# Patient Record
Sex: Female | Born: 1945 | Race: White | Hispanic: No | Marital: Married | Smoking: Never smoker
Health system: Southern US, Community
[De-identification: ages and names within clinical notes are randomized; demographics above are authoritative.]

## PROBLEM LIST (undated history)

## (undated) DIAGNOSIS — J45909 Unspecified asthma, uncomplicated: Secondary | ICD-10-CM

## (undated) DIAGNOSIS — F32A Depression, unspecified: Secondary | ICD-10-CM

## (undated) DIAGNOSIS — F329 Major depressive disorder, single episode, unspecified: Secondary | ICD-10-CM

## (undated) HISTORY — PX: JOINT REPLACEMENT: SHX530

---

## 2009-08-27 ENCOUNTER — Inpatient Hospital Stay (HOSPITAL_COMMUNITY): Admission: RE | Admit: 2009-08-27 | Discharge: 2009-08-31 | Payer: Self-pay | Admitting: Orthopedic Surgery

## 2009-10-11 ENCOUNTER — Observation Stay (HOSPITAL_COMMUNITY): Admission: RE | Admit: 2009-10-11 | Discharge: 2009-10-13 | Payer: Self-pay | Admitting: Orthopedic Surgery

## 2010-02-13 ENCOUNTER — Ambulatory Visit (HOSPITAL_COMMUNITY)
Admission: RE | Admit: 2010-02-13 | Discharge: 2010-02-14 | Payer: Self-pay | Source: Home / Self Care | Admitting: Orthopedic Surgery

## 2010-07-25 LAB — URINALYSIS, ROUTINE W REFLEX MICROSCOPIC
Glucose, UA: NEGATIVE mg/dL
Hgb urine dipstick: NEGATIVE
Protein, ur: NEGATIVE mg/dL
Specific Gravity, Urine: 1.035 — ABNORMAL HIGH (ref 1.005–1.030)
pH: 5.5 (ref 5.0–8.0)

## 2010-07-25 LAB — COMPREHENSIVE METABOLIC PANEL
ALT: 15 U/L (ref 0–35)
Albumin: 4 g/dL (ref 3.5–5.2)
Alkaline Phosphatase: 88 U/L (ref 39–117)
Chloride: 105 mEq/L (ref 96–112)
Glucose, Bld: 109 mg/dL — ABNORMAL HIGH (ref 70–99)
Potassium: 3.9 mEq/L (ref 3.5–5.1)
Sodium: 140 mEq/L (ref 135–145)
Total Bilirubin: 0.7 mg/dL (ref 0.3–1.2)
Total Protein: 7.4 g/dL (ref 6.0–8.3)

## 2010-07-25 LAB — PROTIME-INR: Prothrombin Time: 13.6 seconds (ref 11.6–15.2)

## 2010-07-25 LAB — SURGICAL PCR SCREEN: MRSA, PCR: NEGATIVE

## 2010-07-25 LAB — CBC
HCT: 40 % (ref 36.0–46.0)
RBC: 4.49 MIL/uL (ref 3.87–5.11)
RDW: 14.8 % (ref 11.5–15.5)
WBC: 6.7 10*3/uL (ref 4.0–10.5)

## 2010-07-29 LAB — URINE MICROSCOPIC-ADD ON

## 2010-07-29 LAB — COMPREHENSIVE METABOLIC PANEL
AST: 23 U/L (ref 0–37)
Albumin: 3.7 g/dL (ref 3.5–5.2)
CO2: 29 mEq/L (ref 19–32)
Calcium: 9.1 mg/dL (ref 8.4–10.5)
Creatinine, Ser: 0.54 mg/dL (ref 0.4–1.2)
GFR calc Af Amer: >60 mL/min (ref >60)
GFR calc non Af Amer: >60 mL/min (ref >60)
Total Protein: 7.6 g/dL (ref 6.0–8.3)

## 2010-07-29 LAB — URINALYSIS, ROUTINE W REFLEX MICROSCOPIC
Nitrite: NEGATIVE
Specific Gravity, Urine: 1.031 — ABNORMAL HIGH (ref 1.005–1.030)
pH: 5.5 (ref 5.0–8.0)

## 2010-07-29 LAB — CBC
MCHC: 33.5 g/dL (ref 30.0–36.0)
MCV: 92.7 fL (ref 78.0–100.0)
Platelets: 212 10*3/uL (ref 150–400)
RDW: 13.2 % (ref 11.5–15.5)

## 2010-07-29 LAB — APTT: aPTT: 29 seconds (ref 24–37)

## 2010-07-30 LAB — CBC
HCT: 34.3 % — ABNORMAL LOW (ref 36.0–46.0)
HCT: 35.2 % — ABNORMAL LOW (ref 36.0–46.0)
Hemoglobin: 10.8 g/dL — ABNORMAL LOW (ref 12.0–15.0)
Hemoglobin: 11.9 g/dL — ABNORMAL LOW (ref 12.0–15.0)
MCHC: 33.7 g/dL (ref 30.0–36.0)
MCHC: 34.4 g/dL (ref 30.0–36.0)
MCV: 93.2 fL (ref 78.0–100.0)
MCV: 93.5 fL (ref 78.0–100.0)
MCV: 94.1 fL (ref 78.0–100.0)
Platelets: 112 10*3/uL — ABNORMAL LOW (ref 150–400)
RBC: 3.74 MIL/uL — ABNORMAL LOW (ref 3.87–5.11)
RDW: 13 % (ref 11.5–15.5)
RDW: 13.4 % (ref 11.5–15.5)

## 2010-07-30 LAB — TYPE AND SCREEN
ABO/RH(D): O NEG
DAT, IgG: NEGATIVE

## 2010-07-30 LAB — BASIC METABOLIC PANEL
BUN: 4 mg/dL — ABNORMAL LOW (ref 6–23)
BUN: 7 mg/dL (ref 6–23)
CO2: 29 mEq/L (ref 19–32)
CO2: 30 mEq/L (ref 19–32)
CO2: 32 mEq/L (ref 19–32)
Calcium: 8.2 mg/dL — ABNORMAL LOW (ref 8.4–10.5)
Chloride: 103 mEq/L (ref 96–112)
Chloride: 103 mEq/L (ref 96–112)
Chloride: 106 mEq/L (ref 96–112)
Chloride: 99 mEq/L (ref 96–112)
Creatinine, Ser: 0.5 mg/dL (ref 0.4–1.2)
Creatinine, Ser: 0.55 mg/dL (ref 0.4–1.2)
Creatinine, Ser: 0.71 mg/dL (ref 0.4–1.2)
GFR calc Af Amer: >60 mL/min (ref >60)
Glucose, Bld: 135 mg/dL — ABNORMAL HIGH (ref 70–99)
Glucose, Bld: 93 mg/dL (ref 70–99)
Glucose, Bld: 95 mg/dL (ref 70–99)
Potassium: 4 mEq/L (ref 3.5–5.1)
Sodium: 137 mEq/L (ref 135–145)
Sodium: 138 mEq/L (ref 135–145)

## 2010-07-30 LAB — PROTIME-INR
INR: 2 — ABNORMAL HIGH (ref 0.00–1.49)
Prothrombin Time: 17.9 seconds — ABNORMAL HIGH (ref 11.6–15.2)

## 2010-07-31 LAB — COMPREHENSIVE METABOLIC PANEL
ALT: 23 U/L (ref 0–35)
AST: 28 U/L (ref 0–37)
Albumin: 3.9 g/dL (ref 3.5–5.2)
CO2: 30 mEq/L (ref 19–32)
Calcium: 9.3 mg/dL (ref 8.4–10.5)
Chloride: 104 mEq/L (ref 96–112)
GFR calc Af Amer: >60 mL/min (ref >60)
GFR calc non Af Amer: >60 mL/min (ref >60)
Sodium: 141 mEq/L (ref 135–145)
Total Bilirubin: 0.9 mg/dL (ref 0.3–1.2)

## 2010-07-31 LAB — URINALYSIS, ROUTINE W REFLEX MICROSCOPIC
Glucose, UA: NEGATIVE mg/dL
Ketones, ur: NEGATIVE mg/dL
Specific Gravity, Urine: 1.026 (ref 1.005–1.030)
pH: 7 (ref 5.0–8.0)

## 2010-07-31 LAB — CBC
HCT: 41.2 % (ref 36.0–46.0)
Hemoglobin: 14.1 g/dL (ref 12.0–15.0)
MCHC: 34.1 g/dL (ref 30.0–36.0)
MCV: 92.9 fL (ref 78.0–100.0)
RBC: 4.44 MIL/uL (ref 3.87–5.11)
RDW: 13.5 % (ref 11.5–15.5)

## 2014-06-21 DIAGNOSIS — M5416 Radiculopathy, lumbar region: Secondary | ICD-10-CM | POA: Diagnosis not present

## 2014-06-21 DIAGNOSIS — M9983 Other biomechanical lesions of lumbar region: Secondary | ICD-10-CM | POA: Diagnosis not present

## 2014-07-28 DIAGNOSIS — J4531 Mild persistent asthma with (acute) exacerbation: Secondary | ICD-10-CM | POA: Diagnosis not present

## 2014-07-28 DIAGNOSIS — J069 Acute upper respiratory infection, unspecified: Secondary | ICD-10-CM | POA: Diagnosis not present

## 2014-08-28 DIAGNOSIS — E6609 Other obesity due to excess calories: Secondary | ICD-10-CM | POA: Diagnosis not present

## 2014-08-28 DIAGNOSIS — F324 Major depressive disorder, single episode, in partial remission: Secondary | ICD-10-CM | POA: Diagnosis not present

## 2014-08-28 DIAGNOSIS — E782 Mixed hyperlipidemia: Secondary | ICD-10-CM | POA: Diagnosis not present

## 2014-08-28 DIAGNOSIS — J4530 Mild persistent asthma, uncomplicated: Secondary | ICD-10-CM | POA: Diagnosis not present

## 2014-09-05 DIAGNOSIS — N39490 Overflow incontinence: Secondary | ICD-10-CM | POA: Diagnosis not present

## 2014-09-05 DIAGNOSIS — E6609 Other obesity due to excess calories: Secondary | ICD-10-CM | POA: Diagnosis not present

## 2014-09-05 DIAGNOSIS — Z1389 Encounter for screening for other disorder: Secondary | ICD-10-CM | POA: Diagnosis not present

## 2014-09-05 DIAGNOSIS — F5101 Primary insomnia: Secondary | ICD-10-CM | POA: Diagnosis not present

## 2014-09-05 DIAGNOSIS — R7301 Impaired fasting glucose: Secondary | ICD-10-CM | POA: Diagnosis not present

## 2014-09-05 DIAGNOSIS — E782 Mixed hyperlipidemia: Secondary | ICD-10-CM | POA: Diagnosis not present

## 2014-09-05 DIAGNOSIS — F324 Major depressive disorder, single episode, in partial remission: Secondary | ICD-10-CM | POA: Diagnosis not present

## 2014-09-05 DIAGNOSIS — J4530 Mild persistent asthma, uncomplicated: Secondary | ICD-10-CM | POA: Diagnosis not present

## 2014-12-09 DIAGNOSIS — M546 Pain in thoracic spine: Secondary | ICD-10-CM | POA: Diagnosis not present

## 2015-02-22 DIAGNOSIS — N39490 Overflow incontinence: Secondary | ICD-10-CM | POA: Diagnosis not present

## 2015-02-22 DIAGNOSIS — Z23 Encounter for immunization: Secondary | ICD-10-CM | POA: Diagnosis not present

## 2015-02-22 DIAGNOSIS — F324 Major depressive disorder, single episode, in partial remission: Secondary | ICD-10-CM | POA: Diagnosis not present

## 2015-02-22 DIAGNOSIS — E782 Mixed hyperlipidemia: Secondary | ICD-10-CM | POA: Diagnosis not present

## 2015-02-22 DIAGNOSIS — J4530 Mild persistent asthma, uncomplicated: Secondary | ICD-10-CM | POA: Diagnosis not present

## 2015-02-22 DIAGNOSIS — E6609 Other obesity due to excess calories: Secondary | ICD-10-CM | POA: Diagnosis not present

## 2015-02-22 DIAGNOSIS — F5101 Primary insomnia: Secondary | ICD-10-CM | POA: Diagnosis not present

## 2015-02-22 DIAGNOSIS — R7301 Impaired fasting glucose: Secondary | ICD-10-CM | POA: Diagnosis not present

## 2015-08-22 DIAGNOSIS — R7301 Impaired fasting glucose: Secondary | ICD-10-CM | POA: Diagnosis not present

## 2015-08-22 DIAGNOSIS — F324 Major depressive disorder, single episode, in partial remission: Secondary | ICD-10-CM | POA: Diagnosis not present

## 2015-08-22 DIAGNOSIS — E782 Mixed hyperlipidemia: Secondary | ICD-10-CM | POA: Diagnosis not present

## 2015-08-28 DIAGNOSIS — N39490 Overflow incontinence: Secondary | ICD-10-CM | POA: Diagnosis not present

## 2015-08-28 DIAGNOSIS — R05 Cough: Secondary | ICD-10-CM | POA: Diagnosis not present

## 2015-08-28 DIAGNOSIS — Z0001 Encounter for general adult medical examination with abnormal findings: Secondary | ICD-10-CM | POA: Diagnosis not present

## 2015-08-28 DIAGNOSIS — E6609 Other obesity due to excess calories: Secondary | ICD-10-CM | POA: Diagnosis not present

## 2015-08-28 DIAGNOSIS — F5101 Primary insomnia: Secondary | ICD-10-CM | POA: Diagnosis not present

## 2015-08-28 DIAGNOSIS — M545 Low back pain: Secondary | ICD-10-CM | POA: Diagnosis not present

## 2015-08-28 DIAGNOSIS — J4540 Moderate persistent asthma, uncomplicated: Secondary | ICD-10-CM | POA: Diagnosis not present

## 2015-08-28 DIAGNOSIS — R7301 Impaired fasting glucose: Secondary | ICD-10-CM | POA: Diagnosis not present

## 2015-11-19 DIAGNOSIS — M160 Bilateral primary osteoarthritis of hip: Secondary | ICD-10-CM | POA: Diagnosis not present

## 2016-02-27 DIAGNOSIS — Z6838 Body mass index (BMI) 38.0-38.9, adult: Secondary | ICD-10-CM | POA: Diagnosis not present

## 2016-02-27 DIAGNOSIS — F5101 Primary insomnia: Secondary | ICD-10-CM | POA: Diagnosis not present

## 2016-02-27 DIAGNOSIS — R7301 Impaired fasting glucose: Secondary | ICD-10-CM | POA: Diagnosis not present

## 2016-02-27 DIAGNOSIS — J4530 Mild persistent asthma, uncomplicated: Secondary | ICD-10-CM | POA: Diagnosis not present

## 2016-02-27 DIAGNOSIS — Z23 Encounter for immunization: Secondary | ICD-10-CM | POA: Diagnosis not present

## 2016-02-27 DIAGNOSIS — N39490 Overflow incontinence: Secondary | ICD-10-CM | POA: Diagnosis not present

## 2016-02-27 DIAGNOSIS — E782 Mixed hyperlipidemia: Secondary | ICD-10-CM | POA: Diagnosis not present

## 2016-04-15 DIAGNOSIS — Z6838 Body mass index (BMI) 38.0-38.9, adult: Secondary | ICD-10-CM | POA: Diagnosis not present

## 2016-04-15 DIAGNOSIS — M79671 Pain in right foot: Secondary | ICD-10-CM | POA: Diagnosis not present

## 2016-08-25 ENCOUNTER — Emergency Department (HOSPITAL_COMMUNITY)
Admission: EM | Admit: 2016-08-25 | Discharge: 2016-08-25 | Disposition: A | Discharge status: Home / Self Care | Payer: Medicare HMO | Attending: Emergency Medicine | Admitting: Emergency Medicine

## 2016-08-25 ENCOUNTER — Emergency Department (HOSPITAL_COMMUNITY): Payer: Medicare HMO

## 2016-08-25 ENCOUNTER — Encounter (HOSPITAL_COMMUNITY): Payer: Self-pay | Admitting: Emergency Medicine

## 2016-08-25 DIAGNOSIS — Z79899 Other long term (current) drug therapy: Secondary | ICD-10-CM | POA: Insufficient documentation

## 2016-08-25 DIAGNOSIS — M25551 Pain in right hip: Secondary | ICD-10-CM | POA: Diagnosis not present

## 2016-08-25 DIAGNOSIS — W1839XA Other fall on same level, initial encounter: Secondary | ICD-10-CM | POA: Insufficient documentation

## 2016-08-25 DIAGNOSIS — S32591A Other specified fracture of right pubis, initial encounter for closed fracture: Secondary | ICD-10-CM

## 2016-08-25 DIAGNOSIS — J45909 Unspecified asthma, uncomplicated: Secondary | ICD-10-CM | POA: Diagnosis not present

## 2016-08-25 DIAGNOSIS — Y999 Unspecified external cause status: Secondary | ICD-10-CM | POA: Insufficient documentation

## 2016-08-25 DIAGNOSIS — R279 Unspecified lack of coordination: Secondary | ICD-10-CM | POA: Diagnosis not present

## 2016-08-25 DIAGNOSIS — Y929 Unspecified place or not applicable: Secondary | ICD-10-CM | POA: Insufficient documentation

## 2016-08-25 DIAGNOSIS — Y939 Activity, unspecified: Secondary | ICD-10-CM | POA: Insufficient documentation

## 2016-08-25 DIAGNOSIS — S32501A Unspecified fracture of right pubis, initial encounter for closed fracture: Secondary | ICD-10-CM | POA: Diagnosis not present

## 2016-08-25 DIAGNOSIS — Z7401 Bed confinement status: Secondary | ICD-10-CM | POA: Diagnosis not present

## 2016-08-25 DIAGNOSIS — S32511A Fracture of superior rim of right pubis, initial encounter for closed fracture: Secondary | ICD-10-CM | POA: Diagnosis not present

## 2016-08-25 DIAGNOSIS — S79911A Unspecified injury of right hip, initial encounter: Secondary | ICD-10-CM | POA: Diagnosis present

## 2016-08-25 HISTORY — DX: Unspecified asthma, uncomplicated: J45.909

## 2016-08-25 HISTORY — DX: Major depressive disorder, single episode, unspecified: F32.9

## 2016-08-25 HISTORY — DX: Depression, unspecified: F32.A

## 2016-08-25 LAB — COMPREHENSIVE METABOLIC PANEL
ALBUMIN: 4.1 g/dL (ref 3.5–5.0)
ALT: 20 U/L (ref 14–54)
AST: 32 U/L (ref 15–41)
Alkaline Phosphatase: 58 U/L (ref 38–126)
Anion gap: 7 (ref 5–15)
BUN: 12 mg/dL (ref 6–20)
CALCIUM: 9 mg/dL (ref 8.9–10.3)
CO2: 30 mmol/L (ref 22–32)
Chloride: 102 mmol/L (ref 101–111)
Creatinine, Ser: 0.62 mg/dL (ref 0.44–1.00)
GFR calc Af Amer: >60 mL/min (ref >60)
GFR calc non Af Amer: >60 mL/min (ref >60)
Glucose, Bld: 108 mg/dL — ABNORMAL HIGH (ref 65–99)
POTASSIUM: 3.5 mmol/L (ref 3.5–5.1)
Sodium: 139 mmol/L (ref 135–145)
TOTAL PROTEIN: 7.5 g/dL (ref 6.5–8.1)
Total Bilirubin: 1 mg/dL (ref 0.3–1.2)

## 2016-08-25 LAB — CBC WITH DIFFERENTIAL/PLATELET
BASOS ABS: 0 10*3/uL (ref 0.0–0.1)
BASOS PCT: 0 %
EOS PCT: 2 %
Eosinophils Absolute: 0.1 10*3/uL (ref 0.0–0.7)
HEMATOCRIT: 42.3 % (ref 36.0–46.0)
Hemoglobin: 14.2 g/dL (ref 12.0–15.0)
LYMPHS PCT: 21 %
Lymphs Abs: 1.5 10*3/uL (ref 0.7–4.0)
MCH: 31.7 pg (ref 26.0–34.0)
MCHC: 33.6 g/dL (ref 30.0–36.0)
MCV: 94.4 fL (ref 78.0–100.0)
Monocytes Absolute: 0.7 10*3/uL (ref 0.1–1.0)
Monocytes Relative: 9 %
NEUTROS ABS: 4.9 10*3/uL (ref 1.7–7.7)
Neutrophils Relative %: 68 %
PLATELETS: 111 10*3/uL — AB (ref 150–400)
RBC: 4.48 MIL/uL (ref 3.87–5.11)
RDW: 13.5 % (ref 11.5–15.5)
WBC: 7.2 10*3/uL (ref 4.0–10.5)

## 2016-08-25 MED ORDER — HYDROCODONE-ACETAMINOPHEN 7.5-325 MG PO TABS: 1.0000 | ORAL_TABLET | ORAL | 0 refills | Status: AC | PRN

## 2016-08-25 MED ORDER — OXYCODONE-ACETAMINOPHEN 5-325 MG PO TABS
1.0000 | ORAL_TABLET | Freq: Once | ORAL | Status: AC
  Administered 2016-08-25: 1 via ORAL
  Filled 2016-08-25: qty 1

## 2016-08-25 MED ORDER — OXYCODONE-ACETAMINOPHEN 5-325 MG PO TABS
2.0000 | ORAL_TABLET | Freq: Once | ORAL | Status: AC
  Administered 2016-08-25: 2 via ORAL
  Filled 2016-08-25: qty 2

## 2016-08-25 MED ORDER — ONDANSETRON HCL 4 MG PO TABS
4.0000 mg | ORAL_TABLET | Freq: Once | ORAL | Status: AC
  Administered 2016-08-25: 4 mg via ORAL
  Filled 2016-08-25: qty 1

## 2016-08-25 NOTE — ED Notes (Signed)
Patient transported to CT 
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
## 2016-08-25 NOTE — Care Management Note (Signed)
Case Management Note  Patient Details  Name: Linda Hansen MRN: [id number] Date of Birth: [birth year]                CM consult received for [facility] and DME needs. Pt ordered HH nursing, pt, aid, SW. She will need WC. Pt and husband (at bedside) has chosen [facility] from list of DME/HH providers. [facility] rep aware of referral and will obtain pt info from chart and deliver [facility] to ED. Pt/spouse aware HH has 48hrs to initiate HH services.      Expected Discharge Date:      08/25/2016            Expected Discharge Plan:  Home w Home Health Services  In-House Referral:  Clinical Social Work  Discharge planning Services  CM Consult  Post Acute Care Choice:  Durable Medical Equipment, Home Health Choice offered to:  Patient  DME Arranged:  Government social research officer DME Agency:  [facility]  HH Arranged:  RN, PT, Social Work, Nurse's Aide HH Agency:  [facility]  Status of Service:  Completed, signed off  Malcolm Metro, RN 08/25/2016, 1:28 PM

## 2016-08-25 NOTE — ED Triage Notes (Signed)
Pt reports falling backwards over a stool yesterday and has been having right hip pain since then.  States she has been able to walk with significant difficulty since falling.

## 2016-08-25 NOTE — Discharge Instructions (Signed)
You have a new fracture of the right pelvis. Please use walker when up and about. Physical therapy will come to your home for therapy. Use Norco every 6 hours for pain. Use a stool softener while taking this medication.
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
## 2016-08-26 DIAGNOSIS — F329 Major depressive disorder, single episode, unspecified: Secondary | ICD-10-CM | POA: Diagnosis not present

## 2016-08-26 DIAGNOSIS — E669 Obesity, unspecified: Secondary | ICD-10-CM | POA: Diagnosis not present

## 2016-08-26 DIAGNOSIS — M1990 Unspecified osteoarthritis, unspecified site: Secondary | ICD-10-CM | POA: Diagnosis not present

## 2016-08-26 DIAGNOSIS — Z6836 Body mass index (BMI) 36.0-36.9, adult: Secondary | ICD-10-CM | POA: Diagnosis not present

## 2016-08-26 DIAGNOSIS — J45909 Unspecified asthma, uncomplicated: Secondary | ICD-10-CM | POA: Diagnosis not present

## 2016-08-26 DIAGNOSIS — W08XXXD Fall from other furniture, subsequent encounter: Secondary | ICD-10-CM | POA: Diagnosis not present

## 2016-08-26 DIAGNOSIS — S32511D Fracture of superior rim of right pubis, subsequent encounter for fracture with routine healing: Secondary | ICD-10-CM | POA: Diagnosis not present

## 2016-08-27 DIAGNOSIS — M1990 Unspecified osteoarthritis, unspecified site: Secondary | ICD-10-CM | POA: Diagnosis not present

## 2016-08-27 DIAGNOSIS — S32511D Fracture of superior rim of right pubis, subsequent encounter for fracture with routine healing: Secondary | ICD-10-CM | POA: Diagnosis not present

## 2016-08-27 DIAGNOSIS — Z6836 Body mass index (BMI) 36.0-36.9, adult: Secondary | ICD-10-CM | POA: Diagnosis not present

## 2016-08-27 DIAGNOSIS — W08XXXD Fall from other furniture, subsequent encounter: Secondary | ICD-10-CM | POA: Diagnosis not present

## 2016-08-27 DIAGNOSIS — F329 Major depressive disorder, single episode, unspecified: Secondary | ICD-10-CM | POA: Diagnosis not present

## 2016-08-27 DIAGNOSIS — E669 Obesity, unspecified: Secondary | ICD-10-CM | POA: Diagnosis not present

## 2016-08-27 DIAGNOSIS — J45909 Unspecified asthma, uncomplicated: Secondary | ICD-10-CM | POA: Diagnosis not present

## 2016-08-28 DIAGNOSIS — E669 Obesity, unspecified: Secondary | ICD-10-CM | POA: Diagnosis not present

## 2016-08-28 DIAGNOSIS — S32511D Fracture of superior rim of right pubis, subsequent encounter for fracture with routine healing: Secondary | ICD-10-CM | POA: Diagnosis not present

## 2016-08-28 DIAGNOSIS — J45909 Unspecified asthma, uncomplicated: Secondary | ICD-10-CM | POA: Diagnosis not present

## 2016-08-28 DIAGNOSIS — Z6836 Body mass index (BMI) 36.0-36.9, adult: Secondary | ICD-10-CM | POA: Diagnosis not present

## 2016-08-28 DIAGNOSIS — W08XXXD Fall from other furniture, subsequent encounter: Secondary | ICD-10-CM | POA: Diagnosis not present

## 2016-08-28 DIAGNOSIS — M1990 Unspecified osteoarthritis, unspecified site: Secondary | ICD-10-CM | POA: Diagnosis not present

## 2016-08-28 DIAGNOSIS — F329 Major depressive disorder, single episode, unspecified: Secondary | ICD-10-CM | POA: Diagnosis not present

## 2016-08-29 DIAGNOSIS — W08XXXD Fall from other furniture, subsequent encounter: Secondary | ICD-10-CM | POA: Diagnosis not present

## 2016-08-29 DIAGNOSIS — J45909 Unspecified asthma, uncomplicated: Secondary | ICD-10-CM | POA: Diagnosis not present

## 2016-08-29 DIAGNOSIS — M1990 Unspecified osteoarthritis, unspecified site: Secondary | ICD-10-CM | POA: Diagnosis not present

## 2016-08-29 DIAGNOSIS — S32511D Fracture of superior rim of right pubis, subsequent encounter for fracture with routine healing: Secondary | ICD-10-CM | POA: Diagnosis not present

## 2016-08-29 DIAGNOSIS — F329 Major depressive disorder, single episode, unspecified: Secondary | ICD-10-CM | POA: Diagnosis not present

## 2016-08-29 DIAGNOSIS — Z6836 Body mass index (BMI) 36.0-36.9, adult: Secondary | ICD-10-CM | POA: Diagnosis not present

## 2016-08-29 DIAGNOSIS — E669 Obesity, unspecified: Secondary | ICD-10-CM | POA: Diagnosis not present

## 2016-08-31 DIAGNOSIS — Z6836 Body mass index (BMI) 36.0-36.9, adult: Secondary | ICD-10-CM | POA: Diagnosis not present

## 2016-08-31 DIAGNOSIS — W08XXXD Fall from other furniture, subsequent encounter: Secondary | ICD-10-CM | POA: Diagnosis not present

## 2016-08-31 DIAGNOSIS — S32511D Fracture of superior rim of right pubis, subsequent encounter for fracture with routine healing: Secondary | ICD-10-CM | POA: Diagnosis not present

## 2016-08-31 DIAGNOSIS — M1990 Unspecified osteoarthritis, unspecified site: Secondary | ICD-10-CM | POA: Diagnosis not present

## 2016-08-31 DIAGNOSIS — E669 Obesity, unspecified: Secondary | ICD-10-CM | POA: Diagnosis not present

## 2016-08-31 DIAGNOSIS — F329 Major depressive disorder, single episode, unspecified: Secondary | ICD-10-CM | POA: Diagnosis not present

## 2016-08-31 DIAGNOSIS — J45909 Unspecified asthma, uncomplicated: Secondary | ICD-10-CM | POA: Diagnosis not present

## 2016-09-01 DIAGNOSIS — E669 Obesity, unspecified: Secondary | ICD-10-CM | POA: Diagnosis not present

## 2016-09-01 DIAGNOSIS — S32511D Fracture of superior rim of right pubis, subsequent encounter for fracture with routine healing: Secondary | ICD-10-CM | POA: Diagnosis not present

## 2016-09-01 DIAGNOSIS — F329 Major depressive disorder, single episode, unspecified: Secondary | ICD-10-CM | POA: Diagnosis not present

## 2016-09-01 DIAGNOSIS — Z6836 Body mass index (BMI) 36.0-36.9, adult: Secondary | ICD-10-CM | POA: Diagnosis not present

## 2016-09-01 DIAGNOSIS — J45909 Unspecified asthma, uncomplicated: Secondary | ICD-10-CM | POA: Diagnosis not present

## 2016-09-01 DIAGNOSIS — M1990 Unspecified osteoarthritis, unspecified site: Secondary | ICD-10-CM | POA: Diagnosis not present

## 2016-09-01 DIAGNOSIS — W08XXXD Fall from other furniture, subsequent encounter: Secondary | ICD-10-CM | POA: Diagnosis not present

## 2016-09-02 DIAGNOSIS — J45909 Unspecified asthma, uncomplicated: Secondary | ICD-10-CM | POA: Diagnosis not present

## 2016-09-02 DIAGNOSIS — E669 Obesity, unspecified: Secondary | ICD-10-CM | POA: Diagnosis not present

## 2016-09-02 DIAGNOSIS — M1990 Unspecified osteoarthritis, unspecified site: Secondary | ICD-10-CM | POA: Diagnosis not present

## 2016-09-02 DIAGNOSIS — S32511D Fracture of superior rim of right pubis, subsequent encounter for fracture with routine healing: Secondary | ICD-10-CM | POA: Diagnosis not present

## 2016-09-02 DIAGNOSIS — F329 Major depressive disorder, single episode, unspecified: Secondary | ICD-10-CM | POA: Diagnosis not present

## 2016-09-02 DIAGNOSIS — Z6836 Body mass index (BMI) 36.0-36.9, adult: Secondary | ICD-10-CM | POA: Diagnosis not present

## 2016-09-02 DIAGNOSIS — W08XXXD Fall from other furniture, subsequent encounter: Secondary | ICD-10-CM | POA: Diagnosis not present

## 2016-09-03 DIAGNOSIS — J45909 Unspecified asthma, uncomplicated: Secondary | ICD-10-CM | POA: Diagnosis not present

## 2016-09-03 DIAGNOSIS — W08XXXD Fall from other furniture, subsequent encounter: Secondary | ICD-10-CM | POA: Diagnosis not present

## 2016-09-03 DIAGNOSIS — M1990 Unspecified osteoarthritis, unspecified site: Secondary | ICD-10-CM | POA: Diagnosis not present

## 2016-09-03 DIAGNOSIS — S32511D Fracture of superior rim of right pubis, subsequent encounter for fracture with routine healing: Secondary | ICD-10-CM | POA: Diagnosis not present

## 2016-09-03 DIAGNOSIS — F329 Major depressive disorder, single episode, unspecified: Secondary | ICD-10-CM | POA: Diagnosis not present

## 2016-09-03 DIAGNOSIS — E669 Obesity, unspecified: Secondary | ICD-10-CM | POA: Diagnosis not present

## 2016-09-03 DIAGNOSIS — Z6836 Body mass index (BMI) 36.0-36.9, adult: Secondary | ICD-10-CM | POA: Diagnosis not present

## 2016-09-04 DIAGNOSIS — M1990 Unspecified osteoarthritis, unspecified site: Secondary | ICD-10-CM | POA: Diagnosis not present

## 2016-09-04 DIAGNOSIS — S32591A Other specified fracture of right pubis, initial encounter for closed fracture: Secondary | ICD-10-CM | POA: Diagnosis not present

## 2016-09-04 DIAGNOSIS — W08XXXD Fall from other furniture, subsequent encounter: Secondary | ICD-10-CM | POA: Diagnosis not present

## 2016-09-04 DIAGNOSIS — S32511D Fracture of superior rim of right pubis, subsequent encounter for fracture with routine healing: Secondary | ICD-10-CM | POA: Diagnosis not present

## 2016-09-04 DIAGNOSIS — E669 Obesity, unspecified: Secondary | ICD-10-CM | POA: Diagnosis not present

## 2016-09-04 DIAGNOSIS — J45909 Unspecified asthma, uncomplicated: Secondary | ICD-10-CM | POA: Diagnosis not present

## 2016-09-04 DIAGNOSIS — F329 Major depressive disorder, single episode, unspecified: Secondary | ICD-10-CM | POA: Diagnosis not present

## 2016-09-04 DIAGNOSIS — Z6836 Body mass index (BMI) 36.0-36.9, adult: Secondary | ICD-10-CM | POA: Diagnosis not present

## 2016-09-05 DIAGNOSIS — J45909 Unspecified asthma, uncomplicated: Secondary | ICD-10-CM | POA: Diagnosis not present

## 2016-09-05 DIAGNOSIS — S32511D Fracture of superior rim of right pubis, subsequent encounter for fracture with routine healing: Secondary | ICD-10-CM | POA: Diagnosis not present

## 2016-09-05 DIAGNOSIS — Z6836 Body mass index (BMI) 36.0-36.9, adult: Secondary | ICD-10-CM | POA: Diagnosis not present

## 2016-09-05 DIAGNOSIS — E669 Obesity, unspecified: Secondary | ICD-10-CM | POA: Diagnosis not present

## 2016-09-05 DIAGNOSIS — W08XXXD Fall from other furniture, subsequent encounter: Secondary | ICD-10-CM | POA: Diagnosis not present

## 2016-09-05 DIAGNOSIS — F329 Major depressive disorder, single episode, unspecified: Secondary | ICD-10-CM | POA: Diagnosis not present

## 2016-09-05 DIAGNOSIS — M1990 Unspecified osteoarthritis, unspecified site: Secondary | ICD-10-CM | POA: Diagnosis not present

## 2016-09-08 DIAGNOSIS — J45909 Unspecified asthma, uncomplicated: Secondary | ICD-10-CM | POA: Diagnosis not present

## 2016-09-08 DIAGNOSIS — W08XXXD Fall from other furniture, subsequent encounter: Secondary | ICD-10-CM | POA: Diagnosis not present

## 2016-09-08 DIAGNOSIS — S32511D Fracture of superior rim of right pubis, subsequent encounter for fracture with routine healing: Secondary | ICD-10-CM | POA: Diagnosis not present

## 2016-09-08 DIAGNOSIS — E669 Obesity, unspecified: Secondary | ICD-10-CM | POA: Diagnosis not present

## 2016-09-08 DIAGNOSIS — M1990 Unspecified osteoarthritis, unspecified site: Secondary | ICD-10-CM | POA: Diagnosis not present

## 2016-09-08 DIAGNOSIS — Z6836 Body mass index (BMI) 36.0-36.9, adult: Secondary | ICD-10-CM | POA: Diagnosis not present

## 2016-09-08 DIAGNOSIS — F329 Major depressive disorder, single episode, unspecified: Secondary | ICD-10-CM | POA: Diagnosis not present

## 2016-09-09 DIAGNOSIS — Z6836 Body mass index (BMI) 36.0-36.9, adult: Secondary | ICD-10-CM | POA: Diagnosis not present

## 2016-09-09 DIAGNOSIS — E669 Obesity, unspecified: Secondary | ICD-10-CM | POA: Diagnosis not present

## 2016-09-09 DIAGNOSIS — M1990 Unspecified osteoarthritis, unspecified site: Secondary | ICD-10-CM | POA: Diagnosis not present

## 2016-09-09 DIAGNOSIS — F329 Major depressive disorder, single episode, unspecified: Secondary | ICD-10-CM | POA: Diagnosis not present

## 2016-09-09 DIAGNOSIS — W08XXXD Fall from other furniture, subsequent encounter: Secondary | ICD-10-CM | POA: Diagnosis not present

## 2016-09-09 DIAGNOSIS — S32511D Fracture of superior rim of right pubis, subsequent encounter for fracture with routine healing: Secondary | ICD-10-CM | POA: Diagnosis not present

## 2016-09-09 DIAGNOSIS — J45909 Unspecified asthma, uncomplicated: Secondary | ICD-10-CM | POA: Diagnosis not present

## 2016-09-10 DIAGNOSIS — Z6836 Body mass index (BMI) 36.0-36.9, adult: Secondary | ICD-10-CM | POA: Diagnosis not present

## 2016-09-10 DIAGNOSIS — W08XXXD Fall from other furniture, subsequent encounter: Secondary | ICD-10-CM | POA: Diagnosis not present

## 2016-09-10 DIAGNOSIS — S32511D Fracture of superior rim of right pubis, subsequent encounter for fracture with routine healing: Secondary | ICD-10-CM | POA: Diagnosis not present

## 2016-09-10 DIAGNOSIS — F329 Major depressive disorder, single episode, unspecified: Secondary | ICD-10-CM | POA: Diagnosis not present

## 2016-09-10 DIAGNOSIS — J45909 Unspecified asthma, uncomplicated: Secondary | ICD-10-CM | POA: Diagnosis not present

## 2016-09-10 DIAGNOSIS — E669 Obesity, unspecified: Secondary | ICD-10-CM | POA: Diagnosis not present

## 2016-09-10 DIAGNOSIS — M1990 Unspecified osteoarthritis, unspecified site: Secondary | ICD-10-CM | POA: Diagnosis not present

## 2016-09-12 DIAGNOSIS — M1990 Unspecified osteoarthritis, unspecified site: Secondary | ICD-10-CM | POA: Diagnosis not present

## 2016-09-12 DIAGNOSIS — F329 Major depressive disorder, single episode, unspecified: Secondary | ICD-10-CM | POA: Diagnosis not present

## 2016-09-12 DIAGNOSIS — S32511D Fracture of superior rim of right pubis, subsequent encounter for fracture with routine healing: Secondary | ICD-10-CM | POA: Diagnosis not present

## 2016-09-12 DIAGNOSIS — Z6836 Body mass index (BMI) 36.0-36.9, adult: Secondary | ICD-10-CM | POA: Diagnosis not present

## 2016-09-12 DIAGNOSIS — W08XXXD Fall from other furniture, subsequent encounter: Secondary | ICD-10-CM | POA: Diagnosis not present

## 2016-09-12 DIAGNOSIS — J45909 Unspecified asthma, uncomplicated: Secondary | ICD-10-CM | POA: Diagnosis not present

## 2016-09-12 DIAGNOSIS — E669 Obesity, unspecified: Secondary | ICD-10-CM | POA: Diagnosis not present

## 2016-09-15 DIAGNOSIS — F329 Major depressive disorder, single episode, unspecified: Secondary | ICD-10-CM | POA: Diagnosis not present

## 2016-09-15 DIAGNOSIS — W08XXXD Fall from other furniture, subsequent encounter: Secondary | ICD-10-CM | POA: Diagnosis not present

## 2016-09-15 DIAGNOSIS — Z6836 Body mass index (BMI) 36.0-36.9, adult: Secondary | ICD-10-CM | POA: Diagnosis not present

## 2016-09-15 DIAGNOSIS — E669 Obesity, unspecified: Secondary | ICD-10-CM | POA: Diagnosis not present

## 2016-09-15 DIAGNOSIS — M1990 Unspecified osteoarthritis, unspecified site: Secondary | ICD-10-CM | POA: Diagnosis not present

## 2016-09-15 DIAGNOSIS — J45909 Unspecified asthma, uncomplicated: Secondary | ICD-10-CM | POA: Diagnosis not present

## 2016-09-15 DIAGNOSIS — S32511D Fracture of superior rim of right pubis, subsequent encounter for fracture with routine healing: Secondary | ICD-10-CM | POA: Diagnosis not present

## 2016-09-17 DIAGNOSIS — E669 Obesity, unspecified: Secondary | ICD-10-CM | POA: Diagnosis not present

## 2016-09-17 DIAGNOSIS — Z6836 Body mass index (BMI) 36.0-36.9, adult: Secondary | ICD-10-CM | POA: Diagnosis not present

## 2016-09-17 DIAGNOSIS — W08XXXD Fall from other furniture, subsequent encounter: Secondary | ICD-10-CM | POA: Diagnosis not present

## 2016-09-17 DIAGNOSIS — F329 Major depressive disorder, single episode, unspecified: Secondary | ICD-10-CM | POA: Diagnosis not present

## 2016-09-17 DIAGNOSIS — J45909 Unspecified asthma, uncomplicated: Secondary | ICD-10-CM | POA: Diagnosis not present

## 2016-09-17 DIAGNOSIS — M1990 Unspecified osteoarthritis, unspecified site: Secondary | ICD-10-CM | POA: Diagnosis not present

## 2016-09-17 DIAGNOSIS — S32511D Fracture of superior rim of right pubis, subsequent encounter for fracture with routine healing: Secondary | ICD-10-CM | POA: Diagnosis not present

## 2016-09-23 DIAGNOSIS — E669 Obesity, unspecified: Secondary | ICD-10-CM | POA: Diagnosis not present

## 2016-09-23 DIAGNOSIS — F329 Major depressive disorder, single episode, unspecified: Secondary | ICD-10-CM | POA: Diagnosis not present

## 2016-09-23 DIAGNOSIS — S32511D Fracture of superior rim of right pubis, subsequent encounter for fracture with routine healing: Secondary | ICD-10-CM | POA: Diagnosis not present

## 2016-09-23 DIAGNOSIS — Z6836 Body mass index (BMI) 36.0-36.9, adult: Secondary | ICD-10-CM | POA: Diagnosis not present

## 2016-09-23 DIAGNOSIS — M1990 Unspecified osteoarthritis, unspecified site: Secondary | ICD-10-CM | POA: Diagnosis not present

## 2016-09-23 DIAGNOSIS — J45909 Unspecified asthma, uncomplicated: Secondary | ICD-10-CM | POA: Diagnosis not present

## 2016-09-23 DIAGNOSIS — W08XXXD Fall from other furniture, subsequent encounter: Secondary | ICD-10-CM | POA: Diagnosis not present

## 2016-09-25 DIAGNOSIS — Z6836 Body mass index (BMI) 36.0-36.9, adult: Secondary | ICD-10-CM | POA: Diagnosis not present

## 2016-09-25 DIAGNOSIS — W08XXXD Fall from other furniture, subsequent encounter: Secondary | ICD-10-CM | POA: Diagnosis not present

## 2016-09-25 DIAGNOSIS — E669 Obesity, unspecified: Secondary | ICD-10-CM | POA: Diagnosis not present

## 2016-09-25 DIAGNOSIS — F329 Major depressive disorder, single episode, unspecified: Secondary | ICD-10-CM | POA: Diagnosis not present

## 2016-09-25 DIAGNOSIS — M1990 Unspecified osteoarthritis, unspecified site: Secondary | ICD-10-CM | POA: Diagnosis not present

## 2016-09-25 DIAGNOSIS — J45909 Unspecified asthma, uncomplicated: Secondary | ICD-10-CM | POA: Diagnosis not present

## 2016-09-25 DIAGNOSIS — S32511D Fracture of superior rim of right pubis, subsequent encounter for fracture with routine healing: Secondary | ICD-10-CM | POA: Diagnosis not present

## 2016-09-29 DIAGNOSIS — M1990 Unspecified osteoarthritis, unspecified site: Secondary | ICD-10-CM | POA: Diagnosis not present

## 2016-09-29 DIAGNOSIS — S32511D Fracture of superior rim of right pubis, subsequent encounter for fracture with routine healing: Secondary | ICD-10-CM | POA: Diagnosis not present

## 2016-09-29 DIAGNOSIS — F329 Major depressive disorder, single episode, unspecified: Secondary | ICD-10-CM | POA: Diagnosis not present

## 2016-09-29 DIAGNOSIS — J45909 Unspecified asthma, uncomplicated: Secondary | ICD-10-CM | POA: Diagnosis not present

## 2016-09-29 DIAGNOSIS — W08XXXD Fall from other furniture, subsequent encounter: Secondary | ICD-10-CM | POA: Diagnosis not present

## 2016-09-29 DIAGNOSIS — Z6836 Body mass index (BMI) 36.0-36.9, adult: Secondary | ICD-10-CM | POA: Diagnosis not present

## 2016-09-29 DIAGNOSIS — E669 Obesity, unspecified: Secondary | ICD-10-CM | POA: Diagnosis not present

## 2016-10-02 DIAGNOSIS — S32591D Other specified fracture of right pubis, subsequent encounter for fracture with routine healing: Secondary | ICD-10-CM | POA: Diagnosis not present

## 2016-10-03 DIAGNOSIS — M1990 Unspecified osteoarthritis, unspecified site: Secondary | ICD-10-CM | POA: Diagnosis not present

## 2016-10-03 DIAGNOSIS — W08XXXD Fall from other furniture, subsequent encounter: Secondary | ICD-10-CM | POA: Diagnosis not present

## 2016-10-03 DIAGNOSIS — F329 Major depressive disorder, single episode, unspecified: Secondary | ICD-10-CM | POA: Diagnosis not present

## 2016-10-03 DIAGNOSIS — E669 Obesity, unspecified: Secondary | ICD-10-CM | POA: Diagnosis not present

## 2016-10-03 DIAGNOSIS — J45909 Unspecified asthma, uncomplicated: Secondary | ICD-10-CM | POA: Diagnosis not present

## 2016-10-03 DIAGNOSIS — S32511D Fracture of superior rim of right pubis, subsequent encounter for fracture with routine healing: Secondary | ICD-10-CM | POA: Diagnosis not present

## 2016-10-03 DIAGNOSIS — Z6836 Body mass index (BMI) 36.0-36.9, adult: Secondary | ICD-10-CM | POA: Diagnosis not present

## 2016-10-06 DIAGNOSIS — J45909 Unspecified asthma, uncomplicated: Secondary | ICD-10-CM | POA: Diagnosis not present

## 2016-10-06 DIAGNOSIS — W08XXXD Fall from other furniture, subsequent encounter: Secondary | ICD-10-CM | POA: Diagnosis not present

## 2016-10-06 DIAGNOSIS — E669 Obesity, unspecified: Secondary | ICD-10-CM | POA: Diagnosis not present

## 2016-10-06 DIAGNOSIS — S32511D Fracture of superior rim of right pubis, subsequent encounter for fracture with routine healing: Secondary | ICD-10-CM | POA: Diagnosis not present

## 2016-10-06 DIAGNOSIS — M1990 Unspecified osteoarthritis, unspecified site: Secondary | ICD-10-CM | POA: Diagnosis not present

## 2016-10-06 DIAGNOSIS — F329 Major depressive disorder, single episode, unspecified: Secondary | ICD-10-CM | POA: Diagnosis not present

## 2016-10-06 DIAGNOSIS — Z6836 Body mass index (BMI) 36.0-36.9, adult: Secondary | ICD-10-CM | POA: Diagnosis not present

## 2016-10-07 DIAGNOSIS — S32511D Fracture of superior rim of right pubis, subsequent encounter for fracture with routine healing: Secondary | ICD-10-CM | POA: Diagnosis not present

## 2016-10-07 DIAGNOSIS — F329 Major depressive disorder, single episode, unspecified: Secondary | ICD-10-CM | POA: Diagnosis not present

## 2016-10-07 DIAGNOSIS — J45909 Unspecified asthma, uncomplicated: Secondary | ICD-10-CM | POA: Diagnosis not present

## 2016-10-07 DIAGNOSIS — Z6836 Body mass index (BMI) 36.0-36.9, adult: Secondary | ICD-10-CM | POA: Diagnosis not present

## 2016-10-07 DIAGNOSIS — W08XXXD Fall from other furniture, subsequent encounter: Secondary | ICD-10-CM | POA: Diagnosis not present

## 2016-10-07 DIAGNOSIS — E669 Obesity, unspecified: Secondary | ICD-10-CM | POA: Diagnosis not present

## 2016-10-07 DIAGNOSIS — M1990 Unspecified osteoarthritis, unspecified site: Secondary | ICD-10-CM | POA: Diagnosis not present

## 2016-10-08 DIAGNOSIS — M1990 Unspecified osteoarthritis, unspecified site: Secondary | ICD-10-CM | POA: Diagnosis not present

## 2016-10-08 DIAGNOSIS — S32511D Fracture of superior rim of right pubis, subsequent encounter for fracture with routine healing: Secondary | ICD-10-CM | POA: Diagnosis not present

## 2016-10-08 DIAGNOSIS — W08XXXD Fall from other furniture, subsequent encounter: Secondary | ICD-10-CM | POA: Diagnosis not present

## 2016-10-08 DIAGNOSIS — E669 Obesity, unspecified: Secondary | ICD-10-CM | POA: Diagnosis not present

## 2016-10-08 DIAGNOSIS — Z6836 Body mass index (BMI) 36.0-36.9, adult: Secondary | ICD-10-CM | POA: Diagnosis not present

## 2016-10-08 DIAGNOSIS — J45909 Unspecified asthma, uncomplicated: Secondary | ICD-10-CM | POA: Diagnosis not present

## 2016-10-08 DIAGNOSIS — F329 Major depressive disorder, single episode, unspecified: Secondary | ICD-10-CM | POA: Diagnosis not present

## 2016-10-15 DIAGNOSIS — J45909 Unspecified asthma, uncomplicated: Secondary | ICD-10-CM | POA: Diagnosis not present

## 2016-10-15 DIAGNOSIS — F329 Major depressive disorder, single episode, unspecified: Secondary | ICD-10-CM | POA: Diagnosis not present

## 2016-10-15 DIAGNOSIS — Z6836 Body mass index (BMI) 36.0-36.9, adult: Secondary | ICD-10-CM | POA: Diagnosis not present

## 2016-10-15 DIAGNOSIS — W08XXXD Fall from other furniture, subsequent encounter: Secondary | ICD-10-CM | POA: Diagnosis not present

## 2016-10-15 DIAGNOSIS — S32511D Fracture of superior rim of right pubis, subsequent encounter for fracture with routine healing: Secondary | ICD-10-CM | POA: Diagnosis not present

## 2016-10-15 DIAGNOSIS — E669 Obesity, unspecified: Secondary | ICD-10-CM | POA: Diagnosis not present

## 2016-10-15 DIAGNOSIS — M1990 Unspecified osteoarthritis, unspecified site: Secondary | ICD-10-CM | POA: Diagnosis not present

## 2016-10-17 DIAGNOSIS — E669 Obesity, unspecified: Secondary | ICD-10-CM | POA: Diagnosis not present

## 2016-10-17 DIAGNOSIS — M1990 Unspecified osteoarthritis, unspecified site: Secondary | ICD-10-CM | POA: Diagnosis not present

## 2016-10-17 DIAGNOSIS — Z6836 Body mass index (BMI) 36.0-36.9, adult: Secondary | ICD-10-CM | POA: Diagnosis not present

## 2016-10-17 DIAGNOSIS — F329 Major depressive disorder, single episode, unspecified: Secondary | ICD-10-CM | POA: Diagnosis not present

## 2016-10-17 DIAGNOSIS — S32511D Fracture of superior rim of right pubis, subsequent encounter for fracture with routine healing: Secondary | ICD-10-CM | POA: Diagnosis not present

## 2016-10-17 DIAGNOSIS — W08XXXD Fall from other furniture, subsequent encounter: Secondary | ICD-10-CM | POA: Diagnosis not present

## 2016-10-17 DIAGNOSIS — J45909 Unspecified asthma, uncomplicated: Secondary | ICD-10-CM | POA: Diagnosis not present

## 2016-10-20 DIAGNOSIS — W08XXXD Fall from other furniture, subsequent encounter: Secondary | ICD-10-CM | POA: Diagnosis not present

## 2016-10-20 DIAGNOSIS — J45909 Unspecified asthma, uncomplicated: Secondary | ICD-10-CM | POA: Diagnosis not present

## 2016-10-20 DIAGNOSIS — Z6836 Body mass index (BMI) 36.0-36.9, adult: Secondary | ICD-10-CM | POA: Diagnosis not present

## 2016-10-20 DIAGNOSIS — F329 Major depressive disorder, single episode, unspecified: Secondary | ICD-10-CM | POA: Diagnosis not present

## 2016-10-20 DIAGNOSIS — S32511D Fracture of superior rim of right pubis, subsequent encounter for fracture with routine healing: Secondary | ICD-10-CM | POA: Diagnosis not present

## 2016-10-20 DIAGNOSIS — M1990 Unspecified osteoarthritis, unspecified site: Secondary | ICD-10-CM | POA: Diagnosis not present

## 2016-10-20 DIAGNOSIS — E669 Obesity, unspecified: Secondary | ICD-10-CM | POA: Diagnosis not present

## 2016-10-21 DIAGNOSIS — F329 Major depressive disorder, single episode, unspecified: Secondary | ICD-10-CM | POA: Diagnosis not present

## 2016-10-21 DIAGNOSIS — E669 Obesity, unspecified: Secondary | ICD-10-CM | POA: Diagnosis not present

## 2016-10-21 DIAGNOSIS — W08XXXD Fall from other furniture, subsequent encounter: Secondary | ICD-10-CM | POA: Diagnosis not present

## 2016-10-21 DIAGNOSIS — S32511D Fracture of superior rim of right pubis, subsequent encounter for fracture with routine healing: Secondary | ICD-10-CM | POA: Diagnosis not present

## 2016-10-21 DIAGNOSIS — Z6836 Body mass index (BMI) 36.0-36.9, adult: Secondary | ICD-10-CM | POA: Diagnosis not present

## 2016-10-21 DIAGNOSIS — M1990 Unspecified osteoarthritis, unspecified site: Secondary | ICD-10-CM | POA: Diagnosis not present

## 2016-10-21 DIAGNOSIS — J45909 Unspecified asthma, uncomplicated: Secondary | ICD-10-CM | POA: Diagnosis not present

## 2016-10-22 DIAGNOSIS — J45909 Unspecified asthma, uncomplicated: Secondary | ICD-10-CM | POA: Diagnosis not present

## 2016-10-22 DIAGNOSIS — M1990 Unspecified osteoarthritis, unspecified site: Secondary | ICD-10-CM | POA: Diagnosis not present

## 2016-10-22 DIAGNOSIS — F329 Major depressive disorder, single episode, unspecified: Secondary | ICD-10-CM | POA: Diagnosis not present

## 2016-10-22 DIAGNOSIS — Z6836 Body mass index (BMI) 36.0-36.9, adult: Secondary | ICD-10-CM | POA: Diagnosis not present

## 2016-10-22 DIAGNOSIS — S32511D Fracture of superior rim of right pubis, subsequent encounter for fracture with routine healing: Secondary | ICD-10-CM | POA: Diagnosis not present

## 2016-10-22 DIAGNOSIS — W08XXXD Fall from other furniture, subsequent encounter: Secondary | ICD-10-CM | POA: Diagnosis not present

## 2016-10-22 DIAGNOSIS — E669 Obesity, unspecified: Secondary | ICD-10-CM | POA: Diagnosis not present

## 2016-10-23 DIAGNOSIS — S32591D Other specified fracture of right pubis, subsequent encounter for fracture with routine healing: Secondary | ICD-10-CM | POA: Diagnosis not present

## 2016-10-23 DIAGNOSIS — M160 Bilateral primary osteoarthritis of hip: Secondary | ICD-10-CM | POA: Diagnosis not present

## 2016-12-29 DIAGNOSIS — F324 Major depressive disorder, single episode, in partial remission: Secondary | ICD-10-CM | POA: Diagnosis not present

## 2016-12-29 DIAGNOSIS — R7301 Impaired fasting glucose: Secondary | ICD-10-CM | POA: Diagnosis not present

## 2016-12-29 DIAGNOSIS — E782 Mixed hyperlipidemia: Secondary | ICD-10-CM | POA: Diagnosis not present

## 2017-01-01 DIAGNOSIS — F5101 Primary insomnia: Secondary | ICD-10-CM | POA: Diagnosis not present

## 2017-01-01 DIAGNOSIS — Z6838 Body mass index (BMI) 38.0-38.9, adult: Secondary | ICD-10-CM | POA: Diagnosis not present

## 2017-01-01 DIAGNOSIS — Z23 Encounter for immunization: Secondary | ICD-10-CM | POA: Diagnosis not present

## 2017-01-01 DIAGNOSIS — R7301 Impaired fasting glucose: Secondary | ICD-10-CM | POA: Diagnosis not present

## 2017-01-01 DIAGNOSIS — N39490 Overflow incontinence: Secondary | ICD-10-CM | POA: Diagnosis not present

## 2017-01-01 DIAGNOSIS — E782 Mixed hyperlipidemia: Secondary | ICD-10-CM | POA: Diagnosis not present

## 2017-01-01 DIAGNOSIS — J4530 Mild persistent asthma, uncomplicated: Secondary | ICD-10-CM | POA: Diagnosis not present

## 2017-09-28 DIAGNOSIS — E782 Mixed hyperlipidemia: Secondary | ICD-10-CM | POA: Diagnosis not present

## 2017-09-28 DIAGNOSIS — F324 Major depressive disorder, single episode, in partial remission: Secondary | ICD-10-CM | POA: Diagnosis not present

## 2017-09-28 DIAGNOSIS — R7301 Impaired fasting glucose: Secondary | ICD-10-CM | POA: Diagnosis not present

## 2017-10-01 DIAGNOSIS — N39490 Overflow incontinence: Secondary | ICD-10-CM | POA: Diagnosis not present

## 2017-10-01 DIAGNOSIS — E782 Mixed hyperlipidemia: Secondary | ICD-10-CM | POA: Diagnosis not present

## 2017-10-01 DIAGNOSIS — Z23 Encounter for immunization: Secondary | ICD-10-CM | POA: Diagnosis not present

## 2017-10-01 DIAGNOSIS — F5101 Primary insomnia: Secondary | ICD-10-CM | POA: Diagnosis not present

## 2017-10-01 DIAGNOSIS — R7301 Impaired fasting glucose: Secondary | ICD-10-CM | POA: Diagnosis not present

## 2017-10-01 DIAGNOSIS — J4530 Mild persistent asthma, uncomplicated: Secondary | ICD-10-CM | POA: Diagnosis not present

## 2017-10-01 DIAGNOSIS — E6609 Other obesity due to excess calories: Secondary | ICD-10-CM | POA: Diagnosis not present

## 2017-10-01 DIAGNOSIS — Z0001 Encounter for general adult medical examination with abnormal findings: Secondary | ICD-10-CM | POA: Diagnosis not present

## 2017-10-01 DIAGNOSIS — F324 Major depressive disorder, single episode, in partial remission: Secondary | ICD-10-CM | POA: Diagnosis not present

## 2017-11-12 DIAGNOSIS — S2231XA Fracture of one rib, right side, initial encounter for closed fracture: Secondary | ICD-10-CM | POA: Diagnosis not present

## 2017-11-12 DIAGNOSIS — R0789 Other chest pain: Secondary | ICD-10-CM | POA: Diagnosis not present

## 2017-11-12 DIAGNOSIS — R0602 Shortness of breath: Secondary | ICD-10-CM | POA: Diagnosis not present

## 2017-11-12 DIAGNOSIS — S299XXA Unspecified injury of thorax, initial encounter: Secondary | ICD-10-CM | POA: Diagnosis not present

## 2017-11-12 DIAGNOSIS — S2691XA Contusion of heart, unspecified with or without hemopericardium, initial encounter: Secondary | ICD-10-CM | POA: Diagnosis not present

## 2017-11-12 DIAGNOSIS — S3991XA Unspecified injury of abdomen, initial encounter: Secondary | ICD-10-CM | POA: Diagnosis not present

## 2017-11-12 DIAGNOSIS — F419 Anxiety disorder, unspecified: Secondary | ICD-10-CM | POA: Diagnosis not present

## 2017-11-12 DIAGNOSIS — J45909 Unspecified asthma, uncomplicated: Secondary | ICD-10-CM | POA: Diagnosis not present

## 2017-11-12 DIAGNOSIS — R262 Difficulty in walking, not elsewhere classified: Secondary | ICD-10-CM | POA: Diagnosis not present

## 2017-11-12 DIAGNOSIS — S20211A Contusion of right front wall of thorax, initial encounter: Secondary | ICD-10-CM | POA: Diagnosis not present

## 2017-11-12 DIAGNOSIS — R2689 Other abnormalities of gait and mobility: Secondary | ICD-10-CM | POA: Diagnosis not present

## 2017-11-12 DIAGNOSIS — S2221XA Fracture of manubrium, initial encounter for closed fracture: Secondary | ICD-10-CM | POA: Diagnosis not present

## 2017-11-12 DIAGNOSIS — S20219A Contusion of unspecified front wall of thorax, initial encounter: Secondary | ICD-10-CM | POA: Diagnosis not present

## 2017-11-12 DIAGNOSIS — R531 Weakness: Secondary | ICD-10-CM | POA: Diagnosis not present

## 2017-11-12 DIAGNOSIS — F329 Major depressive disorder, single episode, unspecified: Secondary | ICD-10-CM | POA: Diagnosis not present

## 2017-11-12 DIAGNOSIS — W01198A Fall on same level from slipping, tripping and stumbling with subsequent striking against other object, initial encounter: Secondary | ICD-10-CM | POA: Diagnosis not present

## 2017-11-13 DIAGNOSIS — S2231XA Fracture of one rib, right side, initial encounter for closed fracture: Secondary | ICD-10-CM | POA: Diagnosis not present

## 2017-11-13 DIAGNOSIS — R262 Difficulty in walking, not elsewhere classified: Secondary | ICD-10-CM | POA: Diagnosis not present

## 2017-11-13 DIAGNOSIS — F418 Other specified anxiety disorders: Secondary | ICD-10-CM | POA: Diagnosis not present

## 2017-11-13 DIAGNOSIS — S2221XA Fracture of manubrium, initial encounter for closed fracture: Secondary | ICD-10-CM | POA: Diagnosis not present

## 2017-12-13 DIAGNOSIS — R2689 Other abnormalities of gait and mobility: Secondary | ICD-10-CM | POA: Diagnosis not present

## 2017-12-13 DIAGNOSIS — S2231XA Fracture of one rib, right side, initial encounter for closed fracture: Secondary | ICD-10-CM | POA: Diagnosis not present

## 2017-12-13 DIAGNOSIS — Z6838 Body mass index (BMI) 38.0-38.9, adult: Secondary | ICD-10-CM | POA: Diagnosis not present

## 2017-12-13 DIAGNOSIS — S2220XA Unspecified fracture of sternum, initial encounter for closed fracture: Secondary | ICD-10-CM | POA: Diagnosis not present

## 2018-05-21 DIAGNOSIS — E782 Mixed hyperlipidemia: Secondary | ICD-10-CM | POA: Diagnosis not present

## 2018-05-21 DIAGNOSIS — R5383 Other fatigue: Secondary | ICD-10-CM | POA: Diagnosis not present

## 2018-05-21 DIAGNOSIS — J069 Acute upper respiratory infection, unspecified: Secondary | ICD-10-CM | POA: Diagnosis not present

## 2018-05-21 DIAGNOSIS — J4531 Mild persistent asthma with (acute) exacerbation: Secondary | ICD-10-CM | POA: Diagnosis not present

## 2018-05-21 DIAGNOSIS — R7301 Impaired fasting glucose: Secondary | ICD-10-CM | POA: Diagnosis not present

## 2018-05-21 DIAGNOSIS — Z6838 Body mass index (BMI) 38.0-38.9, adult: Secondary | ICD-10-CM | POA: Diagnosis not present

## 2018-06-01 DIAGNOSIS — N39490 Overflow incontinence: Secondary | ICD-10-CM | POA: Diagnosis not present

## 2018-06-01 DIAGNOSIS — Z6837 Body mass index (BMI) 37.0-37.9, adult: Secondary | ICD-10-CM | POA: Diagnosis not present

## 2018-06-01 DIAGNOSIS — R5383 Other fatigue: Secondary | ICD-10-CM | POA: Diagnosis not present

## 2018-06-01 DIAGNOSIS — M47815 Spondylosis without myelopathy or radiculopathy, thoracolumbar region: Secondary | ICD-10-CM | POA: Diagnosis not present

## 2018-06-01 DIAGNOSIS — E782 Mixed hyperlipidemia: Secondary | ICD-10-CM | POA: Diagnosis not present

## 2018-06-01 DIAGNOSIS — J4530 Mild persistent asthma, uncomplicated: Secondary | ICD-10-CM | POA: Diagnosis not present

## 2018-06-01 DIAGNOSIS — R2689 Other abnormalities of gait and mobility: Secondary | ICD-10-CM | POA: Diagnosis not present

## 2018-06-01 DIAGNOSIS — R7301 Impaired fasting glucose: Secondary | ICD-10-CM | POA: Diagnosis not present

## 2018-06-04 DIAGNOSIS — F5101 Primary insomnia: Secondary | ICD-10-CM | POA: Diagnosis not present

## 2018-06-04 DIAGNOSIS — F419 Anxiety disorder, unspecified: Secondary | ICD-10-CM | POA: Diagnosis not present

## 2018-06-04 DIAGNOSIS — M47815 Spondylosis without myelopathy or radiculopathy, thoracolumbar region: Secondary | ICD-10-CM | POA: Diagnosis not present

## 2018-06-04 DIAGNOSIS — E782 Mixed hyperlipidemia: Secondary | ICD-10-CM | POA: Diagnosis not present

## 2018-06-04 DIAGNOSIS — Z7952 Long term (current) use of systemic steroids: Secondary | ICD-10-CM | POA: Diagnosis not present

## 2018-06-04 DIAGNOSIS — F324 Major depressive disorder, single episode, in partial remission: Secondary | ICD-10-CM | POA: Diagnosis not present

## 2018-06-04 DIAGNOSIS — J4530 Mild persistent asthma, uncomplicated: Secondary | ICD-10-CM | POA: Diagnosis not present

## 2018-06-04 DIAGNOSIS — M48061 Spinal stenosis, lumbar region without neurogenic claudication: Secondary | ICD-10-CM | POA: Diagnosis not present

## 2018-06-07 DIAGNOSIS — M47815 Spondylosis without myelopathy or radiculopathy, thoracolumbar region: Secondary | ICD-10-CM | POA: Diagnosis not present

## 2018-06-07 DIAGNOSIS — E782 Mixed hyperlipidemia: Secondary | ICD-10-CM | POA: Diagnosis not present

## 2018-06-07 DIAGNOSIS — F419 Anxiety disorder, unspecified: Secondary | ICD-10-CM | POA: Diagnosis not present

## 2018-06-07 DIAGNOSIS — M48061 Spinal stenosis, lumbar region without neurogenic claudication: Secondary | ICD-10-CM | POA: Diagnosis not present

## 2018-06-07 DIAGNOSIS — J4530 Mild persistent asthma, uncomplicated: Secondary | ICD-10-CM | POA: Diagnosis not present

## 2018-06-07 DIAGNOSIS — Z7952 Long term (current) use of systemic steroids: Secondary | ICD-10-CM | POA: Diagnosis not present

## 2018-06-07 DIAGNOSIS — F324 Major depressive disorder, single episode, in partial remission: Secondary | ICD-10-CM | POA: Diagnosis not present

## 2018-06-07 DIAGNOSIS — F5101 Primary insomnia: Secondary | ICD-10-CM | POA: Diagnosis not present

## 2018-06-08 DIAGNOSIS — J90 Pleural effusion, not elsewhere classified: Secondary | ICD-10-CM | POA: Diagnosis not present

## 2018-06-08 DIAGNOSIS — Z515 Encounter for palliative care: Secondary | ICD-10-CM | POA: Diagnosis not present

## 2018-06-08 DIAGNOSIS — I161 Hypertensive emergency: Secondary | ICD-10-CM | POA: Diagnosis not present

## 2018-06-08 DIAGNOSIS — I629 Nontraumatic intracranial hemorrhage, unspecified: Secondary | ICD-10-CM | POA: Diagnosis not present

## 2018-06-08 DIAGNOSIS — I615 Nontraumatic intracerebral hemorrhage, intraventricular: Secondary | ICD-10-CM | POA: Diagnosis not present

## 2018-06-08 DIAGNOSIS — Z4682 Encounter for fitting and adjustment of non-vascular catheter: Secondary | ICD-10-CM | POA: Diagnosis not present

## 2018-06-08 DIAGNOSIS — R29712 NIHSS score 12: Secondary | ICD-10-CM | POA: Diagnosis not present

## 2018-06-08 DIAGNOSIS — S06360A Traumatic hemorrhage of cerebrum, unspecified, without loss of consciousness, initial encounter: Secondary | ICD-10-CM | POA: Diagnosis not present

## 2018-06-08 DIAGNOSIS — J9690 Respiratory failure, unspecified, unspecified whether with hypoxia or hypercapnia: Secondary | ICD-10-CM | POA: Diagnosis not present

## 2018-06-08 DIAGNOSIS — I10 Essential (primary) hypertension: Secondary | ICD-10-CM | POA: Diagnosis not present

## 2018-06-08 DIAGNOSIS — E872 Acidosis: Secondary | ICD-10-CM | POA: Diagnosis not present

## 2018-06-08 DIAGNOSIS — R410 Disorientation, unspecified: Secondary | ICD-10-CM | POA: Diagnosis not present

## 2018-06-08 DIAGNOSIS — I6523 Occlusion and stenosis of bilateral carotid arteries: Secondary | ICD-10-CM | POA: Diagnosis not present

## 2018-06-08 DIAGNOSIS — G936 Cerebral edema: Secondary | ICD-10-CM | POA: Diagnosis not present

## 2018-06-08 DIAGNOSIS — Z6841 Body Mass Index (BMI) 40.0 and over, adult: Secondary | ICD-10-CM | POA: Diagnosis not present

## 2018-06-08 DIAGNOSIS — G935 Compression of brain: Secondary | ICD-10-CM | POA: Diagnosis not present

## 2018-06-08 DIAGNOSIS — R29810 Facial weakness: Secondary | ICD-10-CM | POA: Diagnosis not present

## 2018-06-08 DIAGNOSIS — J45909 Unspecified asthma, uncomplicated: Secondary | ICD-10-CM | POA: Diagnosis not present

## 2018-06-08 DIAGNOSIS — D696 Thrombocytopenia, unspecified: Secondary | ICD-10-CM | POA: Diagnosis not present

## 2018-06-08 DIAGNOSIS — I080 Rheumatic disorders of both mitral and aortic valves: Secondary | ICD-10-CM | POA: Diagnosis not present

## 2018-06-08 DIAGNOSIS — I618 Other nontraumatic intracerebral hemorrhage: Secondary | ICD-10-CM | POA: Diagnosis not present

## 2018-06-08 DIAGNOSIS — Z96652 Presence of left artificial knee joint: Secondary | ICD-10-CM | POA: Diagnosis not present

## 2018-06-08 DIAGNOSIS — Z880 Allergy status to penicillin: Secondary | ICD-10-CM | POA: Diagnosis not present

## 2018-06-08 DIAGNOSIS — Z66 Do not resuscitate: Secondary | ICD-10-CM | POA: Diagnosis not present

## 2018-06-08 DIAGNOSIS — R29818 Other symptoms and signs involving the nervous system: Secondary | ICD-10-CM | POA: Diagnosis not present

## 2018-06-08 DIAGNOSIS — J9601 Acute respiratory failure with hypoxia: Secondary | ICD-10-CM | POA: Diagnosis not present

## 2018-06-08 DIAGNOSIS — R4182 Altered mental status, unspecified: Secondary | ICD-10-CM | POA: Diagnosis not present

## 2018-06-08 DIAGNOSIS — W19XXXA Unspecified fall, initial encounter: Secondary | ICD-10-CM | POA: Diagnosis not present

## 2018-06-08 DIAGNOSIS — R404 Transient alteration of awareness: Secondary | ICD-10-CM | POA: Diagnosis not present

## 2018-06-08 DIAGNOSIS — R4781 Slurred speech: Secondary | ICD-10-CM | POA: Diagnosis not present

## 2018-06-08 DIAGNOSIS — R451 Restlessness and agitation: Secondary | ICD-10-CM | POA: Diagnosis not present

## 2018-06-08 DIAGNOSIS — G8191 Hemiplegia, unspecified affecting right dominant side: Secondary | ICD-10-CM | POA: Diagnosis not present

## 2018-06-08 DIAGNOSIS — D72829 Elevated white blood cell count, unspecified: Secondary | ICD-10-CM | POA: Diagnosis not present

## 2018-06-08 DIAGNOSIS — G911 Obstructive hydrocephalus: Secondary | ICD-10-CM | POA: Diagnosis not present

## 2018-06-08 DIAGNOSIS — I611 Nontraumatic intracerebral hemorrhage in hemisphere, cortical: Secondary | ICD-10-CM | POA: Diagnosis not present

## 2018-06-11 ENCOUNTER — Other Ambulatory Visit: Payer: Self-pay | Admitting: *Deleted

## 2018-06-11 MED ORDER — ONDANSETRON HCL 4 MG/2ML IJ SOLN
4.00 | INTRAMUSCULAR | Status: DC
Start: ? — End: 2018-06-11

## 2018-06-11 MED ORDER — FENTANYL CITRATE (PF) 2500 MCG/50ML IJ SOLN
50.00 | INTRAMUSCULAR | Status: DC
Start: ? — End: 2018-06-11

## 2018-06-11 MED ORDER — GENERIC EXTERNAL MEDICATION
10.00 | Status: DC
Start: ? — End: 2018-06-11

## 2018-06-11 MED ORDER — FENTANYL CITRATE-NACL 2.5-0.9 MG/250ML-% IV SOLN
12.50 | INTRAVENOUS | Status: DC
Start: ? — End: 2018-06-11

## 2018-06-11 MED ORDER — FENTANYL CITRATE (PF) 2500 MCG/50ML IJ SOLN
100.00 | INTRAMUSCULAR | Status: DC
Start: ? — End: 2018-06-11

## 2018-06-11 MED ORDER — MIDAZOLAM HCL 2 MG/2ML IJ SOLN
2.00 | INTRAMUSCULAR | Status: DC
Start: ? — End: 2018-06-11

## 2018-06-11 MED ORDER — GLYCOPYRROLATE 0.2 MG/ML IJ SOLN
.20 | INTRAMUSCULAR | Status: DC
Start: ? — End: 2018-06-11

## 2018-06-11 MED ORDER — HYDRALAZINE HCL 20 MG/ML IJ SOLN
10.00 | INTRAMUSCULAR | Status: DC
Start: ? — End: 2018-06-11

## 2018-06-11 MED ORDER — SODIUM CHLORIDE 0.9 % IV SOLN
10.00 | INTRAVENOUS | Status: DC
Start: ? — End: 2018-06-11

## 2018-06-11 NOTE — Patient Outreach (Signed)
[facility]) Care Management  06/11/2018  SEVERINE VETH 17-Jun-1945 [id number]   Transition of Care Referral   Referral Date: 06/11/2018 Referral Source:  [facility] inpatient referral  Date of Admission: 06/08/2018 Diagnosis: Large Left Frontal ICH with IVH Date of Discharge:Facility: [facility] on 2018/06/25. Dx Notes Nontraumatic intracerebral hemorrhage, unspecified  Insurance:  [employer] medicare  First contact to patient to find number, [phone] disconnected  Then with [facility] RN CM review of chart, found pt deceased as of 0030 06/25/18  case closed   Lived with her son, was last seen normal 06/07/2018; was found down morning of 06/08/2018 with difficulty speaking + right hemiplegia transferred from [facility] 06/08/2018 to [facility] for further management of an ICH 06/09/2018 Son decided DNR with comfort care measures. Palliative care was consulted. Patient was terminally extubated at 1804. She expired 0031 on June 25, 2018.   *Nontraumatic cortical hemorrhage of left cerebral hemisphere  06/08/2018   . Cerebral edema 06/08/2018   . Brain compression 06/08/2018  . Hypertensive emergency 06/08/2018 . Acute respiratory failure with hypoxia  Plan: [facility] RN CM will close case at this time as patient is deceased as of 0030 25-Jun-2018   Jenavee Laguardia L. Noelle Penner, RN, BSN, CCM [facility] Telephonic Care Management Care Coordinator Direct Number [phone] Mobile number [phone]  Main [facility] number [phone] Fax number [phone]

## 2018-06-12 DEATH — deceased

## 2018-07-13 IMAGING — DX DG HIP (WITH OR WITHOUT PELVIS) 2-3V*R*
4 series · 4 of 4 positions shown · non-contrast
Comparison: None.

CLINICAL DATA: Right hip pain since a fall yesterday.

EXAM:
DG HIP (WITH OR WITHOUT PELVIS) 2-3V RIGHT

[pelvis ap]
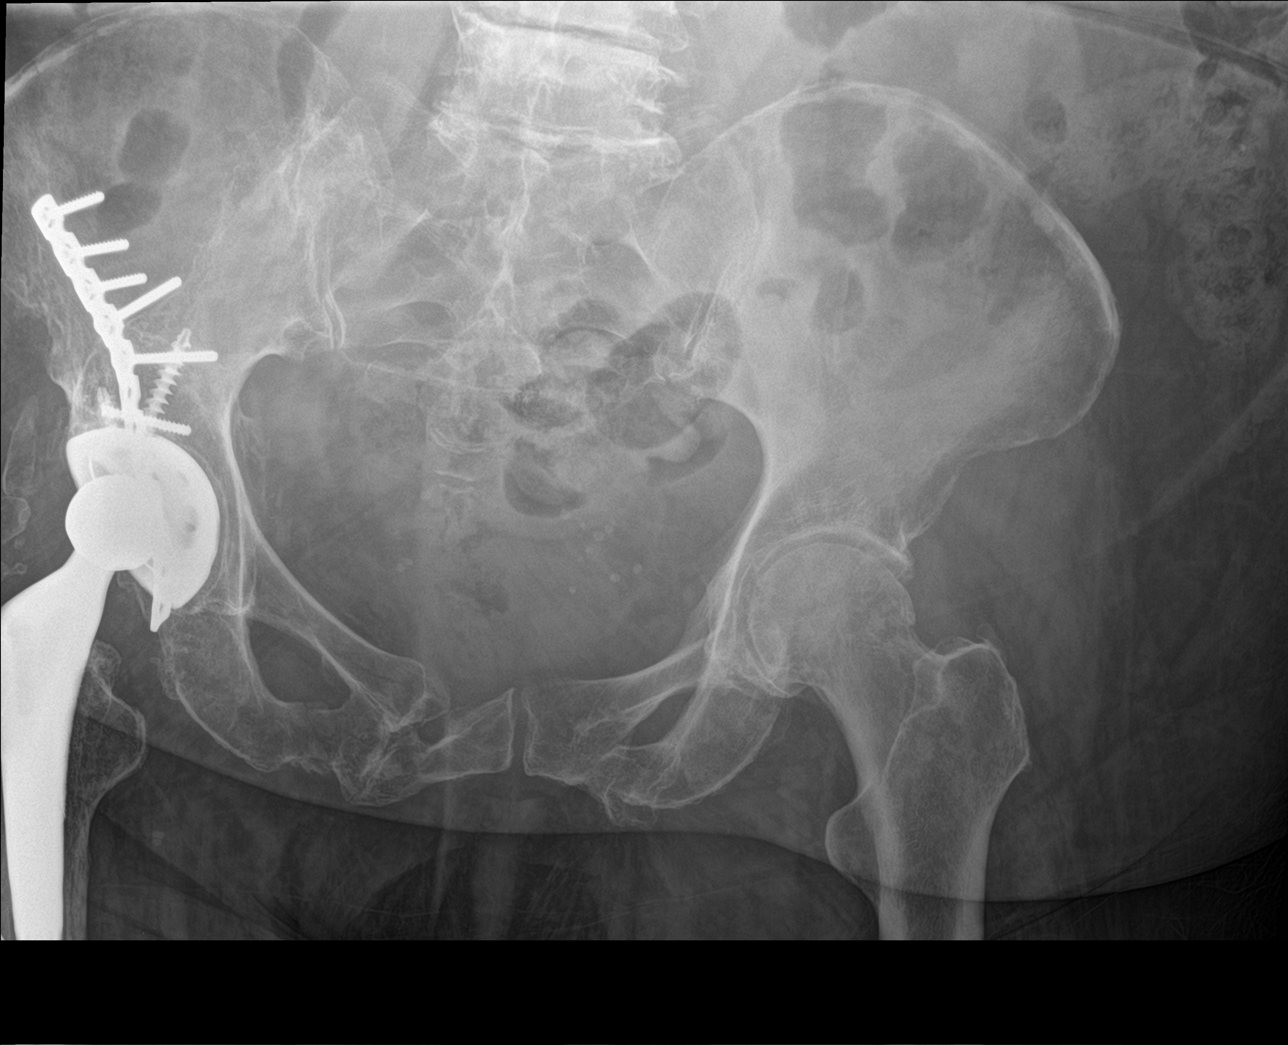

[hip ap (1 of 2)]
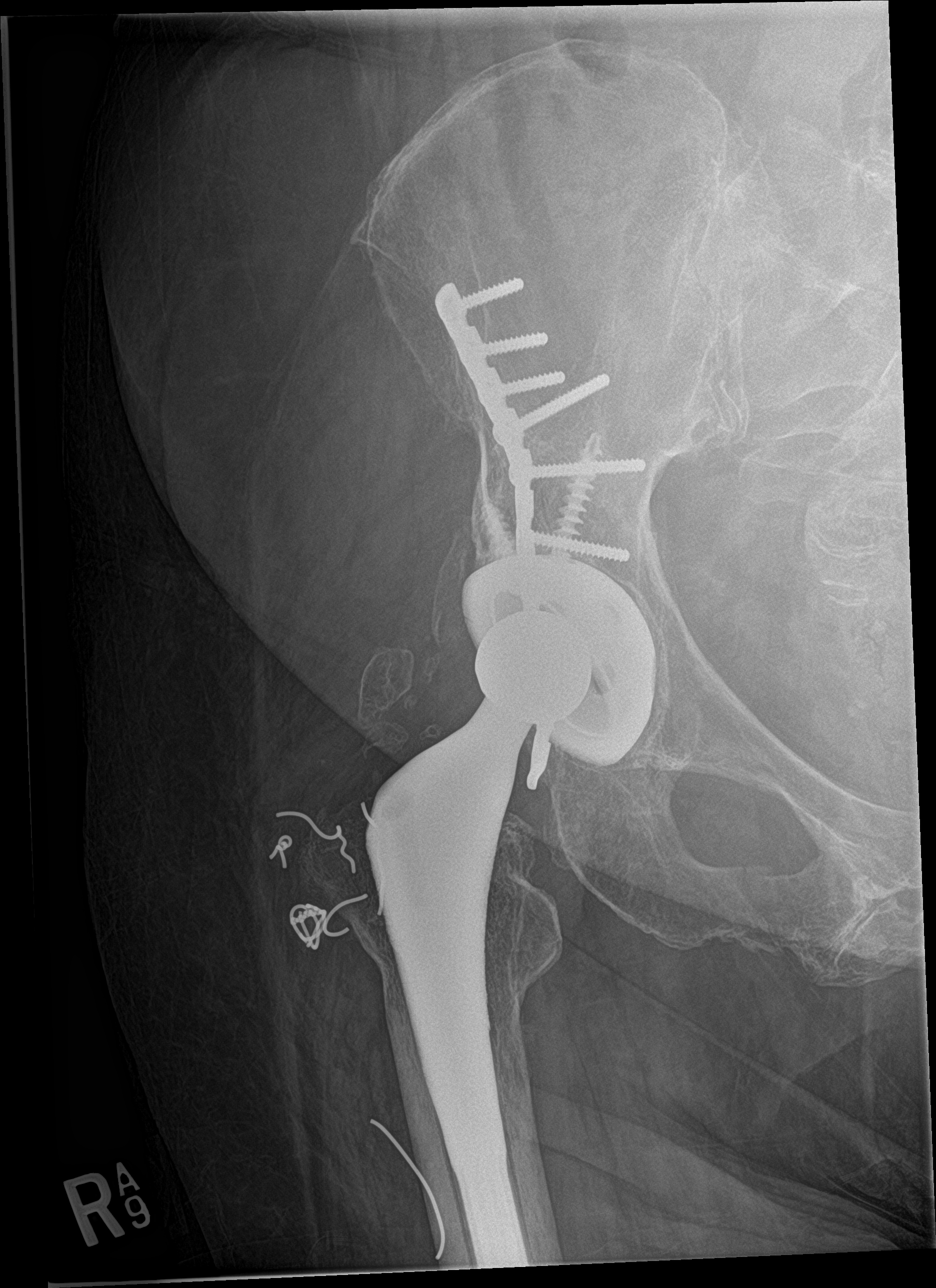

[hip lat]
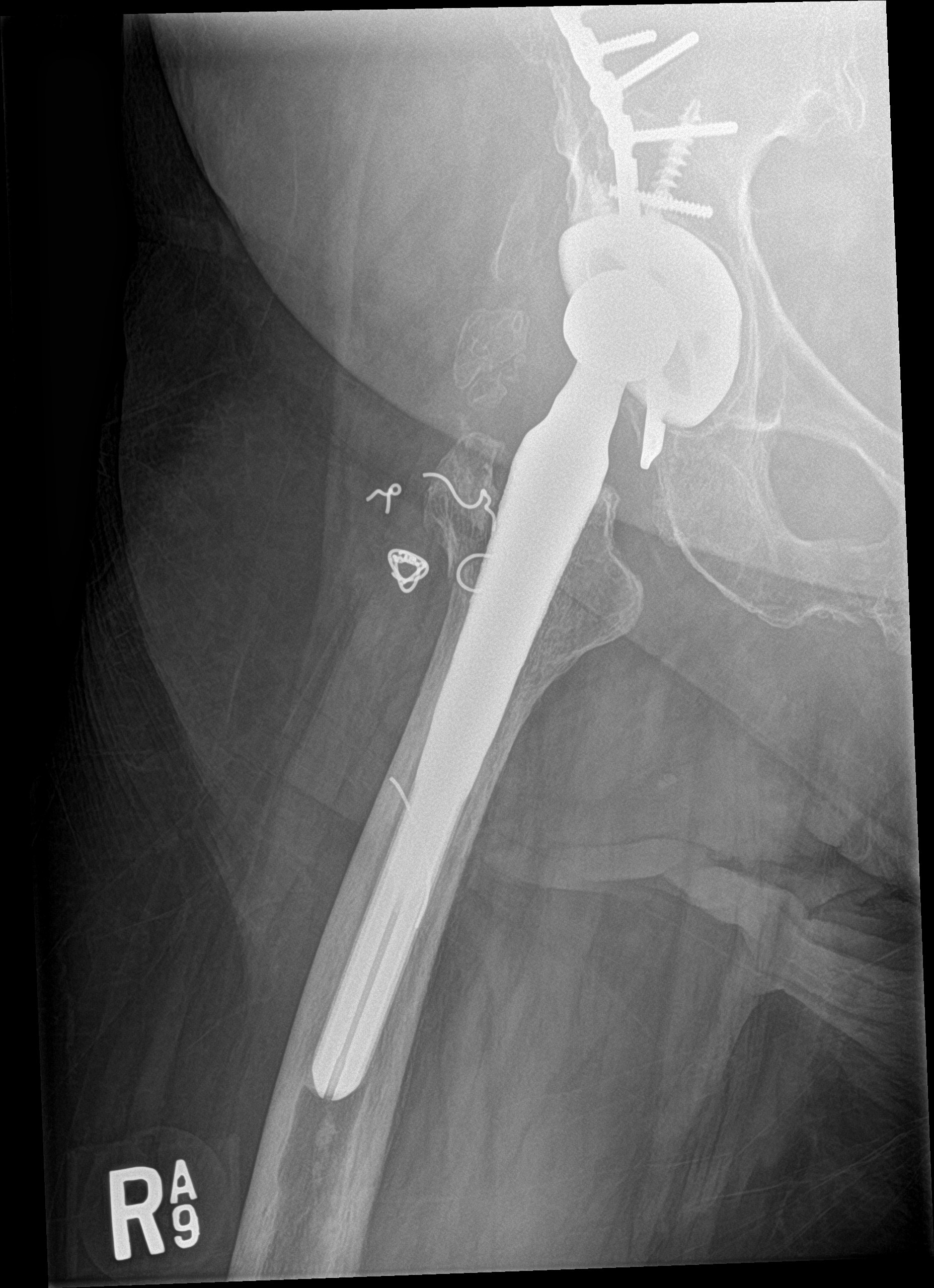

[hip ap (2 of 2)]
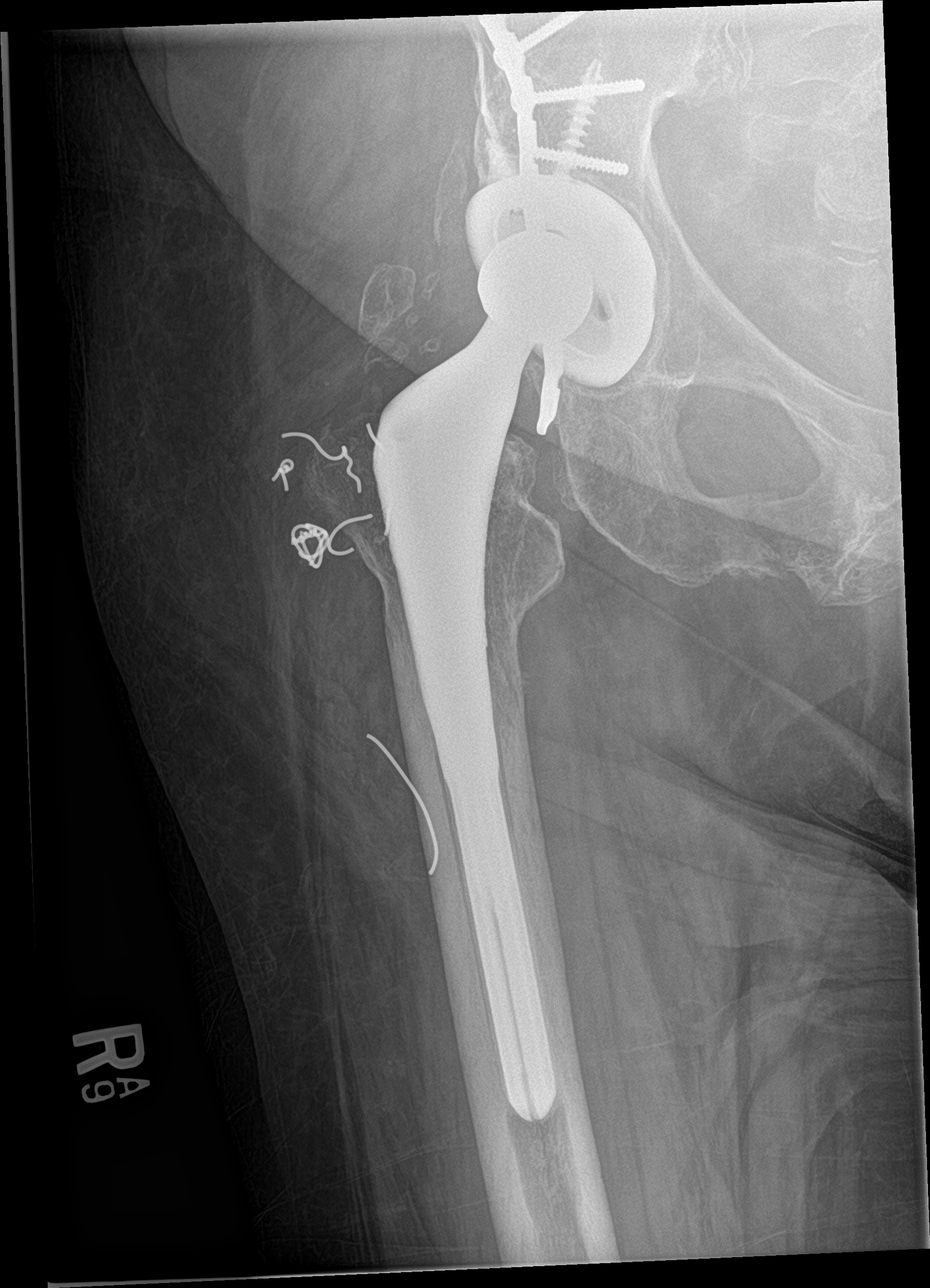

[4 of 4 positions shown; findings below may reference images not displayed]

FINDINGS: There are what appear to be acute fractures through the right
inferior and superior pubic rami, superimposed on old fractures.

Right total hip prosthesis in place. Plate and screws in the right
iliac bone and acetabulum. Dystrophic calcifications around the
right hip joint. Multiple wire fragments in the soft tissues
adjacent to the greater trochanter with 1 fragment which has
migrated distally in the lateral aspect of the proximal thigh.
IMPRESSION: Acute fractures of the right superior and inferior pubic rami.
